# Patient Record
Sex: Male | Born: 1990 | Hispanic: Refuse to answer | Marital: Single | Smoking: Current every day smoker
Health system: Southern US, Community
[De-identification: ages and names within clinical notes are randomized; demographics above are authoritative.]

## PROBLEM LIST (undated history)

## (undated) DIAGNOSIS — Z464 Encounter for fitting and adjustment of orthodontic device: Secondary | ICD-10-CM

## (undated) DIAGNOSIS — T753XXA Motion sickness, initial encounter: Secondary | ICD-10-CM

## (undated) HISTORY — PX: APPENDECTOMY: SHX54

---

## 2016-01-09 ENCOUNTER — Encounter: Payer: Self-pay | Admitting: *Deleted

## 2016-01-09 ENCOUNTER — Ambulatory Visit
Admission: EM | Admit: 2016-01-09 | Discharge: 2016-01-09 | Disposition: A | Discharge status: Home / Self Care | Payer: BLUE CROSS/BLUE SHIELD | Attending: Family Medicine | Admitting: Family Medicine

## 2016-01-09 DIAGNOSIS — J029 Acute pharyngitis, unspecified: Secondary | ICD-10-CM | POA: Diagnosis not present

## 2016-01-09 DIAGNOSIS — H6991 Unspecified Eustachian tube disorder, right ear: Secondary | ICD-10-CM

## 2016-01-09 DIAGNOSIS — H6981 Other specified disorders of Eustachian tube, right ear: Secondary | ICD-10-CM | POA: Diagnosis not present

## 2016-01-09 DIAGNOSIS — H9203 Otalgia, bilateral: Secondary | ICD-10-CM

## 2016-01-09 LAB — RAPID INFLUENZA A&B ANTIGENS (ARMC ONLY): INFLUENZA B (ARMC): NEGATIVE

## 2016-01-09 LAB — RAPID INFLUENZA A&B ANTIGENS: Influenza A (ARMC): NEGATIVE

## 2016-01-09 LAB — RAPID STREP SCREEN (MED CTR MEBANE ONLY): STREPTOCOCCUS, GROUP A SCREEN (DIRECT): NEGATIVE

## 2016-01-09 MED ORDER — ONDANSETRON 8 MG PO TBDP
8.0000 mg | ORAL_TABLET | Freq: Once | ORAL | Status: AC
  Administered 2016-01-09: 8 mg via ORAL

## 2016-01-09 MED ORDER — FLUTICASONE PROPIONATE 50 MCG/ACT NA SUSP: 2.0000 | Freq: Every day | NASAL | Status: DC

## 2016-01-09 MED ORDER — AMOXICILLIN-POT CLAVULANATE 875-125 MG PO TABS: 1.0000 | ORAL_TABLET | Freq: Two times a day (BID) | ORAL | Status: DC

## 2016-01-09 MED ORDER — FEXOFENADINE-PSEUDOEPHED ER 180-240 MG PO TB24: 1.0000 | ORAL_TABLET | Freq: Every day | ORAL | Status: DC

## 2016-01-09 NOTE — ED Notes (Signed)
Bilat ear pain, sore throat, nausea, dizziness, x1 week.

## 2016-01-09 NOTE — ED Provider Notes (Signed)
CSN: [id number]     Arrival date & time 01/09/16  1630 History   First MD Initiated Contact with Patient 01/09/16 1857    Nurses notes were reviewed.  Chief Complaint  Patient presents with  . Otalgia  . Sore Throat  . Nausea  . Dizziness  Translation is by a friend. Apparently patient started having symptoms about 8 days ago. He's had ear pain in both ears worse on the right side than the left sore throat. But things got worse today with increased dizziness and lightheadedness. It has been like this years ago and raises question was the cysts friend raises a question if this is normal. Which courses no weight answer or unable to to have an answer for. Unfortunately he was warned he does need to stop smoking.   No known drug allergies no pertinent family medical history he has had appendectomy for the past. (Consider location/radiation/quality/duration/timing/severity/associated sxs/prior Treatment) Patient is a 25 y.o. male presenting with ear pain, pharyngitis, and dizziness. The history is provided by the patient. The history is limited by a language barrier. A language interpreter was used.  Otalgia Location:  Bilateral (Right is worse in the left) Behind ear:  No abnormality Quality:  Pressure and throbbing Severity:  Moderate Duration:  8 days Progression:  Worsening Chronicity:  New Context: not direct blow and not foreign body in ear   Relieved by:  Nothing Associated symptoms: congestion and cough   Associated symptoms: no abdominal pain and no headaches   Sore Throat This is a new problem. The current episode started more than 1 week ago. The problem occurs constantly. The problem has been gradually worsening. Pertinent negatives include no chest pain, no abdominal pain, no headaches and no shortness of breath. Nothing aggravates the symptoms. He has tried nothing for the symptoms. The treatment provided no relief.  Dizziness Associated symptoms: no chest pain, no headaches and  no shortness of breath     History reviewed. No pertinent past medical history. Past Surgical History  Procedure Laterality Date  . Appendectomy     History reviewed. No pertinent family history. Social History  Substance Use Topics  . Smoking status: Current Every Day Smoker  . Smokeless tobacco: None  . Alcohol Use: No    Review of Systems  HENT: Positive for congestion and ear pain.   Respiratory: Positive for cough. Negative for shortness of breath.   Cardiovascular: Negative for chest pain.  Gastrointestinal: Negative for abdominal pain.  Neurological: Positive for dizziness. Negative for headaches.  All other systems reviewed and are negative.   Allergies  Review of patient's allergies indicates no known allergies.  Home Medications   Prior to Admission medications   Medication Sig Start Date End Date Taking? Authorizing Provider  amoxicillin-clavulanate (AUGMENTIN) 875-125 MG tablet Take 1 tablet by mouth 2 (two) times daily. 01/09/16   Hassan Rowan, MD  fexofenadine-pseudoephedrine (ALLEGRA-D ALLERGY & CONGESTION) 180-240 MG 24 hr tablet Take 1 tablet by mouth daily. 01/09/16   Hassan Rowan, MD  fluticasone (FLONASE) 50 MCG/ACT nasal spray Place 2 sprays into both nostrils daily. 01/09/16   Hassan Rowan, MD   Meds Ordered and Administered this Visit   Medications  ondansetron (ZOFRAN-ODT) disintegrating tablet 8 mg (8 mg Oral Given 01/09/16 1835)    BP 121/70 mmHg  Pulse 64  Temp(Src) 98.1 F (36.7 C)  Resp 16  Ht  (1.702 m)  Wt 155 lb (70.308 kg)  BMI 24.27 kg/m2  SpO2 99% No data  found.   Physical Exam  Constitutional: He is oriented to person, place, and time. He appears well-developed and well-nourished.  HENT:  Head: Normocephalic and atraumatic.  Right Ear: Hearing, external ear and ear canal normal. Tympanic membrane is bulging. A middle ear effusion is present.  Left Ear: Hearing, tympanic membrane, external ear and ear canal normal.  Nose:  Mucosal edema and rhinorrhea present. Right sinus exhibits no maxillary sinus tenderness and no frontal sinus tenderness. Left sinus exhibits no frontal sinus tenderness.  Mouth/Throat: Posterior oropharyngeal erythema present.  Eyes: Pupils are equal, round, and reactive to light.  Neck: Normal range of motion. Neck supple.  Cardiovascular: Normal rate, regular rhythm and normal heart sounds.   Pulmonary/Chest: Effort normal and breath sounds normal. No respiratory distress.  Musculoskeletal: Normal range of motion.  Neurological: He is alert and oriented to person, place, and time.  Skin: Skin is warm.  Psychiatric: He has a normal mood and affect.  Vitals reviewed.   ED Course  Procedures (including critical care time)  Labs Review Labs Reviewed  RAPID INFLUENZA A&B ANTIGENS ([facility] ONLY)  RAPID STREP SCREEN (NOT AT [facility])  CULTURE, GROUP A STREP [facility])    Imaging Review No results found.   Visual Acuity Review  Right Eye Distance:   Left Eye Distance:   Bilateral Distance:    Right Eye Near:   Left Eye Near:    Bilateral Near:      Results for orders placed or performed during the hospital encounter of 01/09/16  Rapid Influenza A&B Antigens ([facility] only)  Result Value Ref Range   Influenza A ([facility]) NEGATIVE NEGATIVE   Influenza B ([facility]) NEGATIVE NEGATIVE  Rapid strep screen  Result Value Ref Range   Streptococcus, Group A Screen (Direct) NEGATIVE NEGATIVE    MDM   1. Otalgia, bilateral   2. Acute pharyngitis, unspecified pharyngitis type   3. Eustachian tube dysfunction, right    we'll place Augmentin 875 one tablet twice a day Allegra-D 24 hours 1 capsule daily and Flonase nasal spray 2 puffs each nostril daily. Follow-up with PCP of choice in one week if not better and work note given for today and tomorrow. Strongly recommend stop smoking as well.    Note: This dictation was prepared with Dragon dictation along with smaller phrase technology. Any  transcriptional errors that result from this process are unintentional.    Hassan RowanEugene Huie Ghuman, MD 01/09/16 2122

## 2016-01-09 NOTE — Discharge Instructions (Signed)
Dizziness Dizziness is a common problem. It makes you feel unsteady or lightheaded. You may feel like you are about to pass out (faint). Dizziness can lead to injury if you stumble or fall. Anyone can get dizzy, but dizziness is more common in older adults. This condition can be caused by a number of things, including:  Medicines.  Dehydration.  Illness. HOME CARE Following these instructions may help with your condition: Eating and Drinking  Drink enough fluid to keep your pee (urine) clear or pale yellow. This helps to keep you from getting dehydrated. Try to drink more clear fluids, such as water.  Do not drink alcohol.  Limit how much caffeine you drink or eat if told by your doctor.  Limit how much salt you drink or eat if told by your doctor. Activity  Avoid making quick movements.  When you stand up from sitting in a chair, steady yourself until you feel okay.  In the morning, first sit up on the side of the bed. When you feel okay, stand slowly while you hold onto something. Do this until you know that your balance is fine.  Move your legs often if you need to stand in one place for a long time. Tighten and relax your muscles in your legs while you are standing.  Do not drive or use heavy machinery if you feel dizzy.  Avoid bending down if you feel dizzy. Place items in your home so that they are easy for you to reach without leaning over. Lifestyle  Do not use any tobacco products, including cigarettes, chewing tobacco, or electronic cigarettes. If you need help quitting, ask your doctor.  Try to lower your stress level, such as with yoga or meditation. Talk with your doctor if you need help. General Instructions  Watch your dizziness for any changes.  Take medicines only as told by your doctor. Talk with your doctor if you think that your dizziness is caused by a medicine that you are taking.  Tell a friend or a family member that you are feeling dizzy. If he or  she notices any changes in your behavior, have this person call your doctor.  Keep all follow-up visits as told by your doctor. This is important. GET HELP IF:  Your dizziness does not go away.  Your dizziness or light-headedness gets worse.  You feel sick to your stomach (nauseous).  You have trouble hearing.  You have new symptoms.  You are unsteady on your feet or you feel like the room is spinning. GET HELP RIGHT AWAY IF:  You throw up (vomit) or have diarrhea and are unable to eat or drink anything.  You have trouble:  Talking.  Walking.  Swallowing.  Using your arms, hands, or legs.  You feel generally weak.  You are not thinking clearly or you have trouble forming sentences. It may take a friend or family member to notice this.  You have:  Chest pain.  Pain in your belly (abdomen).  Shortness of breath.  Sweating.  Your vision changes.  You are bleeding.  You have a headache.  You have neck pain or a stiff neck.  You have a fever.   This information is not intended to replace advice given to you by your health care provider. Make sure you discuss any questions you have with your health care provider.   Document Released: 08/16/2011 Document Revised: 01/11/2015 Document Reviewed: 08/23/2014 Elsevier Interactive Patient Education 2016 Elsevier Inc.  Pharyngitis Pharyngitis is a  sore throat (pharynx). There is redness, pain, and swelling of your throat. HOME CARE   Drink enough fluids to keep your pee (urine) clear or pale yellow.  Only take medicine as told by your doctor.  You may get sick again if you do not take medicine as told. Finish your medicines, even if you start to feel better.  Do not take aspirin.  Rest.  Rinse your mouth (gargle) with salt water ( tsp of salt per 1 qt of water) every 1-2 hours. This will help the pain.  If you are not at risk for choking, you can suck on hard candy or sore throat lozenges. GET HELP  IF:  You have large, tender lumps on your neck.  You have a rash.  You cough up green, yellow-brown, or bloody spit. GET HELP RIGHT AWAY IF:   You have a stiff neck.  You drool or cannot swallow liquids.  You throw up (vomit) or are not able to keep medicine or liquids down.  You have very bad pain that does not go away with medicine.  You have problems breathing (not from a stuffy nose). MAKE SURE YOU:   Understand these instructions.  Will watch your condition.  Will get help right away if you are not doing well or get worse.   This information is not intended to replace advice given to you by your health care provider. Make sure you discuss any questions you have with your health care provider.   Document Released: 02/13/2008 Document Revised: 06/17/2013 Document Reviewed: 05/04/2013 Elsevier Interactive Patient Education 2016 Elsevier Inc.  Upper Respiratory Infection, Adult Most upper respiratory infections (URIs) are caused by a virus. A URI affects the nose, throat, and upper air passages. The most common type of URI is often called "the common cold." HOME CARE   Take medicines only as told by your doctor.  Gargle warm saltwater or take cough drops to comfort your throat as told by your doctor.  Use a warm mist humidifier or inhale steam from a shower to increase air moisture. This may make it easier to breathe.  Drink enough fluid to keep your pee (urine) clear or pale yellow.  Eat soups and other clear broths.  Have a healthy diet.  Rest as needed.  Go back to work when your fever is gone or your doctor says it is okay.  You may need to stay home longer to avoid giving your URI to others.  You can also wear a face mask and wash your hands often to prevent spread of the virus.  Use your inhaler more if you have asthma.  Do not use any tobacco products, including cigarettes, chewing tobacco, or electronic cigarettes. If you need help quitting, ask your  doctor. GET HELP IF:  You are getting worse, not better.  Your symptoms are not helped by medicine.  You have chills.  You are getting more short of breath.  You have brown or red mucus.  You have yellow or brown discharge from your nose.  You have pain in your face, especially when you bend forward.  You have a fever.  You have puffy (swollen) neck glands.  You have pain while swallowing.  You have white areas in the back of your throat. GET HELP RIGHT AWAY IF:   You have very bad or constant:  Headache.  Ear pain.  Pain in your forehead, behind your eyes, and over your cheekbones (sinus pain).  Chest pain.  You have  long-lasting (chronic) lung disease and any of the following:  Wheezing.  Long-lasting cough.  Coughing up blood.  A change in your usual mucus.  You have a stiff neck.  You have changes in your:  Vision.  Hearing.  Thinking.  Mood. MAKE SURE YOU:   Understand these instructions.  Will watch your condition.  Will get help right away if you are not doing well or get worse.   This information is not intended to replace advice given to you by your health care provider. Make sure you discuss any questions you have with your health care provider.   Document Released: 02/13/2008 Document Revised: 01/11/2015 Document Reviewed: 12/02/2013 Elsevier Interactive Patient Education 2016 [employer] Barotitis media is inflammation of your middle ear. This occurs when the auditory tube (eustachian tube) leading from the back of your nose (nasopharynx) to your eardrum is blocked. This blockage may result from a cold, environmental allergies, or an upper respiratory infection. Unresolved barotitis media may lead to damage or hearing loss (barotrauma), which may become permanent. HOME CARE INSTRUCTIONS   Use medicines as recommended by your health care provider. Over-the-counter medicines will help unblock the canal and can help  during times of air travel.  Do not put anything into your ears to clean or unplug them. Eardrops will not be helpful.  Do not swim, dive, or fly until your health care provider says it is all right to do so. If these activities are necessary, chewing gum with frequent, forceful swallowing may help. It is also helpful to hold your nose and gently blow to pop your ears for equalizing pressure changes. This forces air into the eustachian tube.  Only take over-the-counter or prescription medicines for pain, discomfort, or fever as directed by your health care provider.  A decongestant may be helpful in decongesting the middle ear and make pressure equalization easier. SEEK MEDICAL CARE IF:  You experience a serious form of dizziness in which you feel as if the room is spinning and you feel nauseated (vertigo).  Your symptoms only involve one ear. SEEK IMMEDIATE MEDICAL CARE IF:   You develop a severe headache, dizziness, or severe ear pain.  You have bloody or pus-like drainage from your ears.  You develop a fever.  Your problems do not improve or become worse. MAKE SURE YOU:   Understand these instructions.  Will watch your condition.  Will get help right away if you are not doing well or get worse.   This information is not intended to replace advice given to you by your health care provider. Make sure you discuss any questions you have with your health care provider.   Document Released: 08/24/2000 Document Revised: 06/17/2013 Document Reviewed: 03/24/2013 Elsevier Interactive Patient Education [employer]

## 2016-01-12 LAB — CULTURE, GROUP A STREP (THRC)

## 2016-02-13 ENCOUNTER — Other Ambulatory Visit: Payer: Self-pay | Admitting: Family Medicine

## 2016-02-13 DIAGNOSIS — R1011 Right upper quadrant pain: Secondary | ICD-10-CM

## 2016-02-20 ENCOUNTER — Ambulatory Visit: Payer: BLUE CROSS/BLUE SHIELD

## 2016-02-27 ENCOUNTER — Ambulatory Visit
Admission: RE | Admit: 2016-02-27 | Discharge: 2016-02-27 | Disposition: A | Discharge status: Home / Self Care | Payer: BLUE CROSS/BLUE SHIELD | Source: Ambulatory Visit | Attending: Family Medicine | Admitting: Family Medicine

## 2016-02-27 DIAGNOSIS — R1011 Right upper quadrant pain: Secondary | ICD-10-CM | POA: Diagnosis present

## 2016-07-11 ENCOUNTER — Encounter: Payer: Self-pay | Admitting: *Deleted

## 2016-07-11 ENCOUNTER — Emergency Department
Admission: EM | Admit: 2016-07-11 | Discharge: 2016-07-12 | Disposition: A | Discharge status: Home / Self Care | Payer: BLUE CROSS/BLUE SHIELD | Attending: Emergency Medicine | Admitting: Emergency Medicine

## 2016-07-11 ENCOUNTER — Emergency Department: Payer: BLUE CROSS/BLUE SHIELD

## 2016-07-11 DIAGNOSIS — S0990XA Unspecified injury of head, initial encounter: Secondary | ICD-10-CM | POA: Diagnosis present

## 2016-07-11 DIAGNOSIS — F17200 Nicotine dependence, unspecified, uncomplicated: Secondary | ICD-10-CM | POA: Diagnosis not present

## 2016-07-11 DIAGNOSIS — R040 Epistaxis: Secondary | ICD-10-CM

## 2016-07-11 DIAGNOSIS — Y990 Civilian activity done for income or pay: Secondary | ICD-10-CM | POA: Diagnosis not present

## 2016-07-11 DIAGNOSIS — Y9389 Activity, other specified: Secondary | ICD-10-CM | POA: Diagnosis not present

## 2016-07-11 DIAGNOSIS — Z23 Encounter for immunization: Secondary | ICD-10-CM | POA: Insufficient documentation

## 2016-07-11 DIAGNOSIS — S0181XA Laceration without foreign body of other part of head, initial encounter: Secondary | ICD-10-CM

## 2016-07-11 DIAGNOSIS — Y929 Unspecified place or not applicable: Secondary | ICD-10-CM | POA: Diagnosis not present

## 2016-07-11 DIAGNOSIS — S022XXA Fracture of nasal bones, initial encounter for closed fracture: Secondary | ICD-10-CM | POA: Diagnosis not present

## 2016-07-11 MED ORDER — TETANUS-DIPHTH-ACELL PERTUSSIS 5-2.5-18.5 LF-MCG/0.5 IM SUSP
0.5000 mL | Freq: Once | INTRAMUSCULAR | Status: AC
  Administered 2016-07-11: 0.5 mL via INTRAMUSCULAR
  Filled 2016-07-11: qty 0.5
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
## 2016-07-11 NOTE — Discharge Instructions (Signed)
Please keep ice applied to the face 20 minutes every hour for the next 2 days. Return to the ER immediately for any decreased air movement through the left or right nostrils or for any increase in facial or nasal swelling. Take ibuprofen as needed for pain.

## 2016-07-14 ENCOUNTER — Ambulatory Visit
Admission: EM | Admit: 2016-07-14 | Discharge: 2016-07-14 | Disposition: A | Discharge status: Home / Self Care | Payer: BLUE CROSS/BLUE SHIELD | Attending: Family Medicine | Admitting: Family Medicine

## 2016-07-14 ENCOUNTER — Encounter: Payer: Self-pay | Admitting: Gynecology

## 2016-07-14 DIAGNOSIS — H1132 Conjunctival hemorrhage, left eye: Secondary | ICD-10-CM | POA: Diagnosis not present

## 2016-07-14 DIAGNOSIS — S0993XD Unspecified injury of face, subsequent encounter: Secondary | ICD-10-CM
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
## 2016-07-25 ENCOUNTER — Encounter: Payer: Self-pay | Admitting: *Deleted

## 2016-07-31 ENCOUNTER — Ambulatory Visit: Payer: BLUE CROSS/BLUE SHIELD | Admitting: Anesthesiology

## 2016-07-31 ENCOUNTER — Ambulatory Visit
Admission: RE | Admit: 2016-07-31 | Discharge: 2016-07-31 | Disposition: A | Discharge status: Home / Self Care | Payer: BLUE CROSS/BLUE SHIELD | Source: Ambulatory Visit | Attending: Otolaryngology | Admitting: Otolaryngology

## 2016-07-31 ENCOUNTER — Encounter
Admission: RE | Disposition: A | Discharge status: Home / Self Care | Payer: Self-pay | Source: Ambulatory Visit | Attending: Otolaryngology

## 2016-07-31 DIAGNOSIS — Z800 Family history of malignant neoplasm of digestive organs: Secondary | ICD-10-CM | POA: Diagnosis not present

## 2016-07-31 DIAGNOSIS — S022XXA Fracture of nasal bones, initial encounter for closed fracture: Secondary | ICD-10-CM | POA: Insufficient documentation

## 2016-07-31 DIAGNOSIS — J342 Deviated nasal septum: Secondary | ICD-10-CM | POA: Insufficient documentation

## 2016-07-31 DIAGNOSIS — J3489 Other specified disorders of nose and nasal sinuses: Secondary | ICD-10-CM | POA: Insufficient documentation

## 2016-07-31 DIAGNOSIS — Y929 Unspecified place or not applicable: Secondary | ICD-10-CM | POA: Diagnosis not present

## 2016-07-31 DIAGNOSIS — Y939 Activity, unspecified: Secondary | ICD-10-CM | POA: Insufficient documentation

## 2016-07-31 DIAGNOSIS — F17210 Nicotine dependence, cigarettes, uncomplicated: Secondary | ICD-10-CM | POA: Diagnosis not present

## 2016-07-31 HISTORY — PX: CLOSED REDUCTION NASAL FRACTURE: SHX5365

## 2016-07-31 HISTORY — DX: Motion sickness, initial encounter: T75.3XXA

## 2016-07-31 HISTORY — DX: Encounter for fitting and adjustment of orthodontic device: Z46.4

## 2016-07-31 HISTORY — PX: SEPTOPLASTY: SHX2393

## 2016-07-31 SURGERY — SEPTOPLASTY, NOSE
Anesthesia: General | Site: Nose | Wound class: Clean Contaminated

## 2016-07-31 MED ORDER — SUCCINYLCHOLINE CHLORIDE 20 MG/ML IJ SOLN
INTRAMUSCULAR | Status: DC | PRN
  Administered 2016-07-31: 100 mg via INTRAVENOUS

## 2016-07-31 MED ORDER — GLYCOPYRROLATE 0.2 MG/ML IJ SOLN
INTRAMUSCULAR | Status: DC | PRN
  Administered 2016-07-31: 0.1 mg via INTRAVENOUS

## 2016-07-31 MED ORDER — OXYMETAZOLINE HCL 0.05 % NA SOLN
NASAL | Status: DC | PRN
  Administered 2016-07-31: 1 via TOPICAL

## 2016-07-31 MED ORDER — LIDOCAINE-EPINEPHRINE 1 %-1:100000 IJ SOLN
INTRAMUSCULAR | Status: DC | PRN
  Administered 2016-07-31: 3 mL

## 2016-07-31 MED ORDER — ACETAMINOPHEN 10 MG/ML IV SOLN
1000.0000 mg | Freq: Once | INTRAVENOUS | Status: AC
  Administered 2016-07-31: 1000 mg via INTRAVENOUS

## 2016-07-31 MED ORDER — CEFPROZIL 500 MG PO TABS: 500.0000 mg | ORAL_TABLET | Freq: Two times a day (BID) | ORAL | 0 refills | Status: AC

## 2016-07-31 MED ORDER — MIDAZOLAM HCL 5 MG/5ML IJ SOLN
INTRAMUSCULAR | Status: DC | PRN
  Administered 2016-07-31: 2 mg via INTRAVENOUS

## 2016-07-31 MED ORDER — FENTANYL CITRATE (PF) 100 MCG/2ML IJ SOLN
INTRAMUSCULAR | Status: DC | PRN
  Administered 2016-07-31: 100 ug via INTRAVENOUS

## 2016-07-31 MED ORDER — ONDANSETRON HCL 4 MG/2ML IJ SOLN
INTRAMUSCULAR | Status: DC | PRN
  Administered 2016-07-31: 4 mg via INTRAVENOUS

## 2016-07-31 MED ORDER — DEXAMETHASONE SODIUM PHOSPHATE 4 MG/ML IJ SOLN
INTRAMUSCULAR | Status: DC | PRN
  Administered 2016-07-31: 4 mg via INTRAVENOUS

## 2016-07-31 MED ORDER — SCOPOLAMINE 1 MG/3DAYS TD PT72
1.0000 | MEDICATED_PATCH | Freq: Once | TRANSDERMAL | Status: DC
  Administered 2016-07-31: 1.5 mg via TRANSDERMAL

## 2016-07-31 MED ORDER — HYDROCODONE-ACETAMINOPHEN 5-325 MG PO TABS: 1.0000 | ORAL_TABLET | Freq: Four times a day (QID) | ORAL | 0 refills | Status: AC | PRN

## 2016-07-31 MED ORDER — LIDOCAINE HCL (CARDIAC) 20 MG/ML IV SOLN
INTRAVENOUS | Status: DC | PRN
  Administered 2016-07-31: 50 mg via INTRAVENOUS

## 2016-07-31 MED ORDER — FENTANYL CITRATE (PF) 100 MCG/2ML IJ SOLN: 25.0000 ug | INTRAMUSCULAR | Status: DC | PRN

## 2016-07-31 MED ORDER — ONDANSETRON HCL 4 MG/2ML IJ SOLN: 4.0000 mg | Freq: Once | INTRAMUSCULAR | Status: DC | PRN

## 2016-07-31 MED ORDER — PROPOFOL 10 MG/ML IV BOLUS
INTRAVENOUS | Status: DC | PRN
  Administered 2016-07-31: 200 mg via INTRAVENOUS

## 2016-07-31 MED ORDER — LACTATED RINGERS IV SOLN
INTRAVENOUS | Status: DC
  Administered 2016-07-31: 11:00:00 via INTRAVENOUS

## 2016-07-31 MED ORDER — OXYCODONE HCL 5 MG PO TABS
5.0000 mg | ORAL_TABLET | Freq: Once | ORAL | Status: AC | PRN
  Administered 2016-07-31: 5 mg via ORAL

## 2016-07-31 MED ORDER — OXYCODONE HCL 5 MG/5ML PO SOLN: 5.0000 mg | Freq: Once | ORAL | Status: AC | PRN

## 2016-07-31 SURGICAL SUPPLY — 18 items
ADHESIVE MASTISOL STRL (MISCELLANEOUS) ×3 IMPLANT
AQUAPLAST 3X3 FLAT (MISCELLANEOUS) ×3
CANISTER SUCT 1200ML W/VALVE (MISCELLANEOUS) ×3 IMPLANT
CLOSURE WOUND 1/2 X4 (GAUZE/BANDAGES/DRESSINGS) ×1
DRSG NASAL 4CM NASOPORE (MISCELLANEOUS) ×3 IMPLANT
GLOVE BIO SURGEON STRL SZ7.5 (GLOVE) ×9 IMPLANT
KIT ROOM TURNOVER OR (KITS) ×3 IMPLANT
NEEDLE HYPO 25GX1X1/2 BEV (NEEDLE) ×3 IMPLANT
PACK DRAPE NASAL/ENT (PACKS) ×3 IMPLANT
PATTIES SURGICAL .5 X3 (DISPOSABLE) ×3 IMPLANT
SPLINT AQUAPLAST 3X3 FLAT (MISCELLANEOUS) ×1 IMPLANT
SPLINT NASAL SEPTAL BLV .50 ST (MISCELLANEOUS) ×3 IMPLANT
STRIP CLOSURE SKIN 1/2X4 (GAUZE/BANDAGES/DRESSINGS) ×2 IMPLANT
SUT CHROMIC 4 0 RB 1X27 (SUTURE) ×3 IMPLANT
SUT ETHILON 3-0 FS-10 30 BLK (SUTURE) ×3
SUTURE EHLN 3-0 FS-10 30 BLK (SUTURE) ×1 IMPLANT
SYRINGE 10CC LL (SYRINGE) ×3 IMPLANT
TOWEL OR 17X26 4PK STRL BLUE (TOWEL DISPOSABLE) ×3 IMPLANT

## 2016-07-31 NOTE — Anesthesia Postprocedure Evaluation (Signed)
Anesthesia Post Note  Patient: Bobby Harmon  Procedure(s) Performed: Procedure(s) (LRB): SEPTOPLASTY (N/A) CLOSED REDUCTION NASAL FRACTURE (N/A)  Patient location during evaluation: PACU Anesthesia Type: General Level of consciousness: awake and alert and oriented Pain management: satisfactory to patient Vital Signs Assessment: post-procedure vital signs reviewed and stable Respiratory status: spontaneous breathing, nonlabored ventilation and respiratory function stable Cardiovascular status: blood pressure returned to baseline and stable Postop Assessment: Adequate PO intake and No signs of nausea or vomiting Anesthetic complications: no    Bobby BeachStella, Bobby Harmon

## 2016-07-31 NOTE — Op Note (Signed)
07/31/2016  12:42 PM    Bobby Harmon  [id number]   Pre-Op Diagnosis:  Nasal fracture.  Nasal obstruction with septal deviation   Post-op Diagnosis: Same  Procedure: 1) Nasal Septoplasty, 2) closed reduction of nasal fracture  Surgeon:  Sandi MealyBennett, Clova Morlock S  Anesthesia:  General endotracheal  EBL:  Less than 25 cc  Complications:  None  Findings: Depressed nasal fracture, left septal deviation.   Procedure: The patient was taken to the Operating Room and placed in the supine position.  After induction of general endotracheal anesthesia, the table was turned 90 degrees. A time-out was issued to confirm the site and procedure. The nasal septum and inferior turbinates where then injected with 1% lidocaine with epiniephrine, 1: 100,000. The nose was decongested with Afrin soaked pledgets. The patient was then prepped and draped in the usual sterile fashion.   The nasal bones were palpated. There was a depressed fracture on the left. A heavy elevator was used to manipulate the nasal bones back into the midline. Fractures were noted involving the nasal bones bilaterally, and the bones were reduced without difficulty.   Beginning on the left hand side a hemitransfixion incision was then created on the leading edge of the septum on the left.  A subperichondrial plane was elevated posteriorly on the left and taken back to the perpendicular plate of the ethmoid where subperiosteal plane was elevated posteriorly on the left. A large septal spur was identified on the left hand side.  An inferior rim of redundant septal cartilage was removed from where it deviated over the maxillary crest. The perpendicular plate of the ethmoid was separated from the quadrangular cartilage, and a subperiosteal plane elevated on the right of the bony septum. The large septal spur was removed, dividing the septal bone superiorly with Knight scissors, and inferiorly from the maxillary crest with a chisel.    The septum was  then replaced in the midline. Reinspection through each nostril showed excellent reduction of the septal deformity. A left posterior inferior fenestration was then created to allow hematoma drainage.  The septal incision was closed with 4-0 chromic gut suture. A 4-0 plain gut suture was used to reapproximate the septal flaps to the underlying cartilage, utilizing a running, quilting type stitch.   With the septoplasty completed and no active bleeding, nasal septal splints were placed within each nostril and affixed to the septum using a 3-0 nylon suture.  The nasal bones were reinspected. On the left the bone was somewhat loose and difficult to hold into position, so nasal pore absorbable packing was placed on the left side to help support the nasal bone during healing.  The patient was then returned to the anesthesiologist for awakening, and was taken to the Recovery Room in stable condition.  Disposition:   PACU to home  Plan: Soft, bland diet and push fluids. Take pain medications and antibiotics as prescribed. No strenuous activity for 2 weeks. Follow-up Monday for splint removal  Sandi MealyBennett, Herschell Virani S 07/31/2016 12:42 PM

## 2016-07-31 NOTE — Transfer of Care (Addendum)
Immediate Anesthesia Transfer of Care Note  Patient: Bobby Harmon  Procedure(s) Performed: Procedure(s) with comments: SEPTOPLASTY (N/A) - NEEDS INTERPRETER CLOSED REDUCTION NASAL FRACTURE (N/A)  Patient Location: PACU  Anesthesia Type: General ETT  Level of Consciousness: awake, alert  and patient cooperative  Airway and Oxygen Therapy: Patient Spontanous Breathing and Patient connected to supplemental oxygen  Post-op Assessment: Post-op Vital signs reviewed, Patient's Cardiovascular Status Stable, Respiratory Function Stable, Patent Airway and No signs of Nausea or vomiting  Post-op Vital Signs: Reviewed and stable  Complications: No apparent anesthesia complications

## 2016-07-31 NOTE — H&P (Signed)
History and physical reviewed and will be scanned in later. No change in medical status reported by the patient or family, appears stable for surgery. All questions regarding the procedure answered, and patient (or family if a child) expressed understanding of the procedure.  Bobby Harmon S @TODAY@ 

## 2016-07-31 NOTE — Anesthesia Procedure Notes (Signed)
Procedure Name: Intubation Date/Time: 07/31/2016 11:40 AM Performed by: Jimmy PicketAMYOT, Jaleen Grupp Pre-anesthesia Checklist: Patient identified, Emergency Drugs available, Suction available, Patient being monitored and Timeout performed Patient Re-evaluated:Patient Re-evaluated prior to inductionOxygen Delivery Method: Circle system utilized Preoxygenation: Pre-oxygenation with 100% oxygen Intubation Type: IV induction Ventilation: Mask ventilation without difficulty Laryngoscope Size: Mac and 3 Grade View: Grade I Tube type: Oral Rae Tube size: 7.5 mm Number of attempts: 1 Placement Confirmation: ETT inserted through vocal cords under direct vision,  positive ETCO2 and breath sounds checked- equal and bilateral Tube secured with: Tape Dental Injury: Teeth and Oropharynx as per pre-operative assessment

## 2016-07-31 NOTE — Discharge Instructions (Signed)
[facility] °[facility] °ENDOSCOPIC SINUS SURGERY °[facility] ° °What is Functional Endoscopic Sinus Surgery? ° The Surgery involves making the natural openings of the sinuses larger by removing the bony partitions that separate the sinuses from the nasal cavity.  The natural sinus lining is preserved as much as possible to allow the sinuses to resume normal function after the surgery.  In some patients nasal polyps (excessively swollen lining of the sinuses) may be removed to relieve obstruction of the sinus openings.  The surgery is performed through the nose using lighted scopes, which eliminates the need for incisions on the face.  A septoplasty is a different procedure which is sometimes performed with sinus surgery.  It involves straightening the boy partition that separates the two sides of your nose.  A crooked or deviated septum may need repair if is obstructing the sinuses or nasal airflow.  Turbinate reduction is also often performed during sinus surgery.  The turbinates are bony proturberances from the side walls of the nose which swell and can obstruct the nose in patients with sinus and allergy problems.  Their size can be surgically reduced to help relieve nasal obstruction. ° °What Can Sinus Surgery Do For Me? ° Sinus surgery can reduce the frequency of sinus infections requiring antibiotic treatment.  This can provide improvement in nasal congestion, post-nasal drainage, facial pressure and nasal obstruction.  Surgery will NOT prevent you from ever having an infection again, so it usually only for patients who get infections 4 or more times yearly requiring antibiotics, or for infections that do not clear with antibiotics.  It will not cure nasal allergies, so patients with allergies may still require medication to treat their allergies after surgery. Surgery may improve headaches related to sinusitis, however, some people will continue to  require medication to control sinus headaches related to allergies.  Surgery will do nothing for other forms of headache (migraine, tension or cluster). ° °What Are the Risks of Endoscopic Sinus Surgery? ° Current techniques allow surgery to be performed safely with little risk, however, there are rare complications that patients should be aware of.  Because the sinuses are located around the eyes, there is risk of eye injury, including blindness, though again, this would be quite rare. This is usually a result of bleeding behind the eye during surgery, which puts the vision oat risk, though there are treatments to protect the vision and prevent permanent disrupted by surgery causing a leak of the spinal fluid that surrounds the brain.  More serious complications would include bleeding inside the brain cavity or damage to the brain.  Again, all of these complications are uncommon, and spinal fluid leaks can be safely managed surgically if they occur.  The most common complication of sinus surgery is bleeding from the nose, which may require packing or cauterization of the nose.  Continued sinus have polyps may experience recurrence of the polyps requiring revision surgery.  Alterations of sense of smell or injury to the tear ducts are also rare complications.  ° °What is the Surgery Like, and what is the Recovery? ° The Surgery usually takes a couple of hours to perform, and is usually performed under a general anesthetic (completely asleep).  Patients are usually discharged home after a couple of hours.  Sometimes during surgery it is necessary to pack the nose to control bleeding, and the packing is left in place for 24 - 48 hours, and removed by your surgeon.    If a septoplasty was performed during the procedure, there is often a splint placed which must be removed after 5-7 days.   °Discomfort: Pain is usually mild to moderate, and can be controlled by prescription pain medication or acetaminophen (Tylenol).   Aspirin, Ibuprofen (Advil, Motrin), or Naprosyn (Aleve) should be avoided, as they can cause increased bleeding.  Most patients feel sinus pressure like they have a bad head cold for several days.  Sleeping with your head elevated can help reduce swelling and facial pressure, as can ice packs over the face.  A humidifier may be helpful to keep the mucous and blood from drying in the nose.  ° °Diet: There are no specific diet restrictions, however, you should generally start with clear liquids and a light diet of bland foods because the anesthetic can cause some nausea.  Advance your diet depending on how your stomach feels.  Taking your pain medication with food will often help reduce stomach upset which pain medications can cause. ° °Nasal Saline Irrigation: It is important to remove blood clots and dried mucous from the nose as it is healing.  This is done by having you irrigate the nose at least 3 - 4 times daily with a salt water solution.  We recommend using NeilMed Sinus Rinse (available at the drug store).  Fill the squeeze bottle with the solution, bend over a sink, and insert the tip of the squeeze bottle into the nose ½ of an inch.  Point the tip of the squeeze bottle towards the inside corner of the eye on the same side your irrigating.  Squeeze the bottle and gently irrigate the nose.  If you bend forward as you do this, most of the fluid will flow back out of the nose, instead of down your throat.   The solution should be warm, near body temperature, when you irrigate.   Each time you irrigate, you should use a full squeeze bottle.  ° °Note that if you are instructed to use Nasal Steroid Sprays at any time after your surgery, irrigate with saline BEFORE using the steroid spray, so you do not wash it all out of the nose. °Another product, Nasal Saline Gel (such as AYR Nasal Saline Gel) can be applied in each nostril 3 - 4 times daily to moisture the nose and reduce scabbing or crusting. ° °Bleeding:   Bloody drainage from the nose can be expected for several days, and patients are instructed to irrigate their nose frequently with salt water to help remove mucous and blood clots.  The drainage may be dark red or brown, though some fresh blood may be seen intermittently, especially after irrigation.  Do not blow you nose, as bleeding may occur. If you must sneeze, keep your mouth open to allow air to escape through your mouth. ° °If heavy bleeding occurs: Irrigate the nose with saline to rinse out clots, then spray the nose 3 - 4 times with Afrin Nasal Decongestant Spray.  The spray will constrict the blood vessels to slow bleeding.  Pinch the lower half of your nose shut to apply pressure, and lay down with your head elevated.  Ice packs over the nose may help as well. If bleeding persists despite these measures, you should notify your doctor.  Do not use the Afrin routinely to control nasal congestion after surgery, as it can result in worsening congestion and may affect healing.  ° ° ° °Activity: Return to work varies among patients. Most patients will be   out of work at least 5 - 7 days to recover.  Patient may return to work after they are off of narcotic pain medication, and feeling well enough to perform the functions of their job.  Patients must avoid heavy lifting (over 10 pounds) or strenuous physical for 2 weeks after surgery, so your employer may need to assign you to light duty, or keep you out of work longer if light duty is not possible.  NOTE: you should not drive, operate dangerous machinery, do any mentally demanding tasks or make any important legal or financial decisions while on narcotic pain medication and recovering from the general anesthetic.    Call Your Doctor Immediately if You Have Any of the Following: 1. Bleeding that you cannot control with the above measures 2. Loss of vision, double vision, bulging of the eye or black eyes. 3. Fever over 101 degrees 4. Neck stiffness with  severe headache, fever, nausea and change in mental state. You are always encourage to call anytime with concerns, however, please call with requests for pain medication refills during office hours.  Office Endoscopy: During follow-up visits your doctor will remove any packing or splints that may have been placed and evaluate and clean your sinuses endoscopically.  Topical anesthetic will be used to make this as comfortable as possible, though you may want to take your pain medication prior to the visit.  How often this will need to be done varies from patient to patient.  After complete recovery from the surgery, you may need follow-up endoscopy from time to time, particularly if there is concern of recurrent infection or nasal polyps.   General Anesthesia, Adult, Care After These instructions provide you with information about caring for yourself after your procedure. Your health care provider may also give you more specific instructions. Your treatment has been planned according to current medical practices, but problems sometimes occur. Call your health care provider if you have any problems or questions after your procedure. What can I expect after the procedure? After the procedure, it is common to have:  Vomiting.  A sore throat.  Mental slowness. It is common to feel:  Nauseous.  Cold or shivery.  Sleepy.  Tired.  Sore or achy, even in parts of your body where you did not have surgery. Follow these instructions at home: For at least 24 hours after the procedure:  Do not:  Participate in activities where you could fall or become injured.  Drive.  Use heavy machinery.  Drink alcohol.  Take sleeping pills or medicines that cause drowsiness.  Make important decisions or sign legal documents.  Take care of children on your own.  Rest. Eating and drinking  If you vomit, drink water, juice, or soup when you can drink without vomiting.  Drink enough fluid to keep  your urine clear or pale yellow.  Make sure you have little or no nausea before eating solid foods.  Follow the diet recommended by your health care provider. General instructions  Have a responsible adult stay with you until you are awake and alert.  Return to your normal activities as told by your health care provider. Ask your health care provider what activities are safe for you.  Take over-the-counter and prescription medicines only as told by your health care provider.  If you smoke, do not smoke without supervision.  Keep all follow-up visits as told by your health care provider. This is important. Contact a health care provider if:  You continue to  have nausea or vomiting at home, and medicines are not helpful.  You cannot drink fluids or start eating again.  You cannot urinate after 8-12 hours.  You develop a skin rash.  You have fever.  You have increasing redness at the site of your procedure. Get help right away if:  You have difficulty breathing.  You have chest pain.  You have unexpected bleeding.  You feel that you are having a life-threatening or urgent problem. This information is not intended to replace advice given to you by your health care provider. Make sure you discuss any questions you have with your health care provider. Document Released: 12/03/2000 Document Revised: 01/30/2016 Document Reviewed: 08/11/2015 Elsevier Interactive Patient Education  2017 [employer]  Scopolamine skin patches  REMOVE PATCH IN 72 HOURS AND WASH HANDS IMMEDIATELY! What is this medicine? SCOPOLAMINE (skoe POL a meen) is used to prevent nausea and vomiting caused by motion sickness, anesthesia and surgery. This medicine may be used for other purposes; ask your health care provider or pharmacist if you have questions. COMMON BRAND NAME(S): Transderm Scop What should I tell my health care provider before I take this medicine? They need to know if you have any of  these conditions: -glaucoma -kidney or liver disease -an unusual or allergic reaction (especially skin allergy) to scopolamine, atropine, other medicines, foods, dyes, or preservatives -pregnant or trying to get pregnant -breast-feeding How should I use this medicine? This medicine is for external use only. Follow the directions on the prescription label. One patch contains enough medicine to prevent motion sickness for up to 3 days. Apply the patch at least 4 hours before you need it and only wear one disc at a time. Choose an area behind the ear, that is clean, dry, hairless and free from any cuts or irritation. Wipe the area with a clean dry tissue. Peel off the plastic backing of the skin patch, trying not to touch the adhesive side with your hands. Do not cut the patches. Firmly apply to the area you have chosen, with the metallic side of the patch to the skin and the tan-colored side showing. Once firmly in place, wash your hands well with soap and water. Remove the disc after 3 days, or sooner if you no longer need it. After removing the patch, wash your hands and the area behind your ear thoroughly with soap and water. The patch will still contain some medicine after use. To avoid accidental contact or ingestion by children or pets, fold the used patch in half with the sticky side together and throw away in the trash out of the reach of children and pets. If you need to use a second patch after you remove the first, place it behind the other ear. Talk to your pediatrician regarding the use of this medicine in children. Special care may be needed. Overdosage: If you think you have taken too much of this medicine contact a poison control center or emergency room at once. NOTE: This medicine is only for you. Do not share this medicine with others. What if I miss a dose? Make sure you apply the patch at least 4 hours before you need it. You can apply it the night before traveling. What may interact  with this medicine? -benztropine -bethanechol -medicines for anxiety or sleeping problems like diazepam or temazepam -medicines for hay fever and other allergies -medicines for mental depression -muscle relaxants This list may not describe all possible interactions. Give your health care provider  a list of all the medicines, herbs, non-prescription drugs, or dietary supplements you use. Also tell them if you smoke, drink alcohol, or use illegal drugs. Some items may interact with your medicine. What should I watch for while using this medicine? Keep the patch dry, if possible, to prevent it from falling off. Limited contact with water, however, as in bathing or swimming, will not affect the system. If the patch falls off, throw it away and put a new one behind the other ear. You may get drowsy or dizzy. Do not drive, use machinery, or do anything that needs mental alertness until you know how this medicine affects you. Do not stand or sit up quickly, especially if you are an older patient. This reduces the risk of dizzy or fainting spells. Alcohol may interfere with the effect of this medicine. Avoid alcoholic drinks. Your mouth may get dry. Chewing sugarless gum or sucking hard candy, and drinking plenty of water may help. Contact your doctor if the problem does not go away or is severe. This medicine may cause dry eyes and blurred vision. If you wear contact lenses you may feel some discomfort. Lubricating drops may help. See your eye doctor if the problem does not go away or is severe. If you are going to have a magnetic resonance imaging (MRI) procedure, tell your MRI technician if you have this patch on your body. It must be removed before a MRI. What side effects may I notice from receiving this medicine? Side effects that you should report to your doctor or health care professional as soon as possible: -agitation, nervousness, confusion -blurred vision and other eye problems -dizziness,  drowsiness -eye pain or redness in the whites of the eye -hallucinations -pain or difficulty passing urine -skin rash, itching -vomiting Side effects that usually do not require medical attention (report to your doctor or health care professional if they continue or are bothersome): -headache -nausea This list may not describe all possible side effects. Call your doctor for medical advice about side effects. You may report side effects to FDA at 1-800-FDA-1088. Where should I keep my medicine? Keep out of the reach of children. Store at room temperature between 20 and 25 degrees C (68 and 77 degrees F). Throw away any unused medicine after the expiration date. When you remove a patch, fold it and throw it in the trash as described above. NOTE: This sheet is a summary. It may not cover all possible information. If you have questions about this medicine, talk to your doctor, pharmacist, or health care provider.  2017 Elsevier/Gold Standard (2012-01-24 13:31:48)

## 2016-07-31 NOTE — Anesthesia Preprocedure Evaluation (Signed)
Anesthesia Evaluation  Patient identified by MRN, date of birth, ID band Patient awake    Reviewed: Allergy & Precautions, H&P , NPO status , Patient's Chart, lab work & pertinent test results  Airway Mallampati: II  TM Distance: >3 FB Neck ROM: full    Dental no notable dental hx.    Pulmonary Current Smoker,    Pulmonary exam normal        Cardiovascular Normal cardiovascular exam     Neuro/Psych    GI/Hepatic   Endo/Other    Renal/GU      Musculoskeletal   Abdominal   Peds  Hematology   Anesthesia Other Findings   Reproductive/Obstetrics                             Anesthesia Physical Anesthesia Plan  ASA: I  Anesthesia Plan: General ETT   Post-op Pain Management:    Induction:   Airway Management Planned:   Additional Equipment:   Intra-op Plan:   Post-operative Plan:   Informed Consent: I have reviewed the patients History and Physical, chart, labs and discussed the procedure including the risks, benefits and alternatives for the proposed anesthesia with the patient or authorized representative who has indicated his/her understanding and acceptance.     Plan Discussed with:   Anesthesia Plan Comments:         Anesthesia Quick Evaluation

## 2016-08-01 ENCOUNTER — Encounter: Payer: Self-pay | Admitting: Otolaryngology
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
